# Patient Record
Sex: Male | Born: 1985 | Race: White | Hispanic: No | Smoking: Never smoker
Health system: Southern US, Community
[De-identification: ages and names within clinical notes are randomized; demographics above are authoritative.]

## PROBLEM LIST (undated history)

## (undated) DIAGNOSIS — J45909 Unspecified asthma, uncomplicated: Secondary | ICD-10-CM

## (undated) HISTORY — PX: APPENDECTOMY: SHX54

---

## 2015-11-06 ENCOUNTER — Emergency Department: Payer: BLUE CROSS/BLUE SHIELD

## 2015-11-06 ENCOUNTER — Emergency Department
Admission: EM | Admit: 2015-11-06 | Discharge: 2015-11-06 | Disposition: A | Discharge status: Home / Self Care | Payer: BLUE CROSS/BLUE SHIELD | Attending: Emergency Medicine | Admitting: Emergency Medicine

## 2015-11-06 ENCOUNTER — Encounter: Payer: Self-pay | Admitting: Emergency Medicine

## 2015-11-06 DIAGNOSIS — J45901 Unspecified asthma with (acute) exacerbation: Secondary | ICD-10-CM | POA: Insufficient documentation

## 2015-11-06 DIAGNOSIS — R091 Pleurisy: Secondary | ICD-10-CM | POA: Diagnosis present

## 2015-11-06 DIAGNOSIS — R110 Nausea: Secondary | ICD-10-CM | POA: Insufficient documentation

## 2015-11-06 HISTORY — DX: Unspecified asthma, uncomplicated: J45.909

## 2015-11-06 LAB — BASIC METABOLIC PANEL
ANION GAP: 5 (ref 5–15)
BUN: 10 mg/dL (ref 6–20)
CALCIUM: 9 mg/dL (ref 8.9–10.3)
CO2: 26 mmol/L (ref 22–32)
Chloride: 105 mmol/L (ref 101–111)
Creatinine, Ser: 1.17 mg/dL (ref 0.61–1.24)
Glucose, Bld: 96 mg/dL (ref 65–99)
Potassium: 3.7 mmol/L (ref 3.5–5.1)
SODIUM: 136 mmol/L (ref 135–145)

## 2015-11-06 LAB — CBC
HCT: 48.5 % (ref 40.0–52.0)
HEMOGLOBIN: 16.9 g/dL (ref 13.0–18.0)
MCH: 29.4 pg (ref 26.0–34.0)
MCHC: 34.8 g/dL (ref 32.0–36.0)
MCV: 84.7 fL (ref 80.0–100.0)
Platelets: 254 10*3/uL (ref 150–440)
RBC: 5.73 MIL/uL (ref 4.40–5.90)
RDW: 13.1 % (ref 11.5–14.5)
WBC: 7.8 10*3/uL (ref 3.8–10.6)

## 2015-11-06 LAB — TROPONIN I

## 2015-11-06 MED ORDER — IPRATROPIUM-ALBUTEROL 0.5-2.5 (3) MG/3ML IN SOLN
3.0000 mL | Freq: Once | RESPIRATORY_TRACT | Status: AC
  Administered 2015-11-06: 3 mL via RESPIRATORY_TRACT
  Filled 2015-11-06: qty 3

## 2015-11-06 MED ORDER — ALBUTEROL SULFATE HFA 108 (90 BASE) MCG/ACT IN AERS: 2.0000 | INHALATION_SPRAY | Freq: Four times a day (QID) | RESPIRATORY_TRACT | Status: DC | PRN

## 2015-11-06 MED ORDER — IOPAMIDOL (ISOVUE-370) INJECTION 76%
100.0000 mL | Freq: Once | INTRAVENOUS | Status: AC | PRN
  Administered 2015-11-06: 75 mL via INTRAVENOUS
  Filled 2015-11-06: qty 100

## 2015-11-06 NOTE — Discharge Instructions (Signed)
Your exam is reassuring, you are given an prescription for albuterol inhaler. Return to emergency room for any worsening condition including trouble breathing, fever, or any other symptoms concerning to.  Asthma, Adult Asthma is a condition of the lungs in which the airways tighten and narrow. Asthma can make it hard to breathe. Asthma cannot be cured, but medicine and lifestyle changes can help control it. Asthma may be started (triggered) by:  Animal skin flakes (dander).  Dust.  Cockroaches.  Pollen.  Mold.  Smoke.  Cleaning products.  Hair sprays or aerosol sprays.  Paint fumes or strong smells.  Cold air, weather changes, and winds.  Crying or laughing hard.  Stress.  Certain medicines or drugs.  Foods, such as dried fruit, potato chips, and sparkling grape juice.  Infections or conditions (colds, flu).  Exercise.  Certain medical conditions or diseases.  Exercise or tiring activities. HOME CARE   Take medicine as told by your doctor.  Use a peak flow meter as told by your doctor. A peak flow meter is a tool that measures how well the lungs are working.  Record and keep track of the peak flow meter's readings.  Understand and use the asthma action plan. An asthma action plan is a written plan for taking care of your asthma and treating your attacks.  To help prevent asthma attacks:  Do not smoke. Stay away from secondhand smoke.  Change your heating and air conditioning filter often.  Limit your use of fireplaces and wood stoves.  Get rid of pests (such as roaches and mice) and their droppings.  Throw away plants if you see mold on them.  Clean your floors. Dust regularly. Use cleaning products that do not smell.  Have someone vacuum when you are not home. Use a vacuum cleaner with a HEPA filter if possible.  Replace carpet with wood, tile, or vinyl flooring. Carpet can trap animal skin flakes and dust.  Use allergy-proof pillows, mattress  covers, and box spring covers.  Wash bed sheets and blankets every week in hot water and dry them in a dryer.  Use blankets that are made of polyester or cotton.  Clean bathrooms and kitchens with bleach. If possible, have someone repaint the walls in these rooms with mold-resistant paint. Keep out of the rooms that are being cleaned and painted.  Wash hands often. GET HELP IF:  You have make a whistling sound when breaking (wheeze), have shortness of breath, or have a cough even if taking medicine to prevent attacks.  The colored mucus you cough up (sputum) is thicker than usual.  The colored mucus you cough up changes from clear or white to yellow, green, gray, or bloody.  You have problems from the medicine you are taking such as:  A rash.  Itching.  Swelling.  Trouble breathing.  You need reliever medicines more than 2-3 times a week.  Your peak flow measurement is still at 50-79% of your personal best after following the action plan for 1 hour.  You have a fever. GET HELP RIGHT AWAY IF:   You seem to be worse and are not responding to medicine during an asthma attack.  You are short of breath even at rest.  You get short of breath when doing very little activity.  You have trouble eating, drinking, or talking.  You have chest pain.  You have a fast heartbeat.  Your lips or fingernails start to turn blue.  You are light-headed, dizzy, or faint.  Your peak flow is less than 50% of your personal best.   This information is not intended to replace advice given to you by your health care provider. Make sure you discuss any questions you have with your health care provider.   Document Released: 01/04/2008 Document Revised: 04/08/2015 Document Reviewed: 02/14/2013 Elsevier Interactive Patient Education [employer].

## 2015-11-06 NOTE — ED Provider Notes (Signed)
[facility] Emergency Department Provider Note   ____________________________________________  Time seen: I have reviewed the triage vital signs and the triage nursing note.  HISTORY  Chief Complaint Pleurisy and Nausea   Historian Patient  HPI Eugene Bartlett. is a 30 y.o. male with a history of asthma, who recently moved here from [place] and does not have his inhaler. He states that for the past couple months he's been having episodes of shortness of breath, also associated with chest pressure and some pleuritic chest pain. Today he felt nauseated. States that the chest discomfort is worse with coughing and deep breathing. No reported trauma. When asked what the patient thinks is going on, he states "it's my asthma. "Fevers. He does have a grand parent who had blood clots.    Past Medical History  Diagnosis Date  . Asthma     There are no active problems to display for this patient.   History reviewed. No pertinent past surgical history.  Current Outpatient Rx  Name  Route  Sig  Dispense  Refill  . albuterol (PROVENTIL HFA;VENTOLIN HFA) 108 (90 Base) MCG/ACT inhaler   Inhalation   Inhale 2 puffs into the lungs every 6 (six) hours as needed for wheezing or shortness of breath.   1 Inhaler   0     Allergies Review of patient's allergies indicates no known allergies.  No family history on file.  Social History Social History  Substance Use Topics  . Smoking status: Never Smoker   . Smokeless tobacco: Current User    Types: Chew  . Alcohol Use: No    Review of Systems  Constitutional: Negative for fever. Eyes: Negative for visual changes. ENT: Negative for sore throat. Cardiovascular: Positive for chest pain. Respiratory: Positive for shortness of breath. Gastrointestinal: Negative for abdominal pain, vomiting and diarrhea. Genitourinary: Negative for dysuria. Musculoskeletal: Negative for back pain. Skin: Negative for  rash. Neurological: Negative for headache. 10 point Review of Systems otherwise negative ____________________________________________   PHYSICAL EXAM:  VITAL SIGNS: ED Triage Vitals  Enc Vitals Group     BP 11/06/15 1857 123/93 mmHg     Pulse Rate 11/06/15 1857 68     Resp 11/06/15 1857 16     Temp 11/06/15 1857 98.2 F (36.8 C)     Temp Source 11/06/15 1857 Oral     SpO2 11/06/15 1857 98 %     Weight 11/06/15 1857 160 lb (72.576 kg)     Height 11/06/15 1857  (1.803 m)     Head Cir --      Peak Flow --      Pain Score 11/06/15 1856 7     Pain Loc --      Pain Edu? --      Excl. in GC? --      Constitutional: Alert and oriented. Well appearing and in no distress. HEENT   Head: Normocephalic and atraumatic.      Eyes: Conjunctivae are normal. PERRL. Normal extraocular movements.      Ears:         Nose: No congestion/rhinnorhea.   Mouth/Throat: Mucous membranes are moist.   Neck: No stridor. Cardiovascular/Chest: Normal rate, regular rhythm.  No murmurs, rubs, or gallops. Respiratory: Normal respiratory effort without tachypnea nor retractions. Breath sounds are clear and equal bilaterally. Mildly tight air sounds, but no real wheezing, rales or rhonchi Gastrointestinal: Soft. No distention, no guarding, no rebound. Nontender.    Genitourinary/rectal:Deferred Musculoskeletal: Nontender with  normal range of motion in all extremities. No joint effusions.  No lower extremity tenderness.  No edema. Neurologic:  Normal speech and language. No gross or focal neurologic deficits are appreciated. Skin:  Skin is warm, dry and intact. No rash noted. Psychiatric: Mood and affect are normal. Speech and behavior are normal. Patient exhibits appropriate insight and judgment.  ____________________________________________   EKG I, Governor Rooksebecca Jaileigh Weimer, MD, the attending physician have personally viewed and interpreted all ECGs.  34 bpm. Normal sinus rhythm. Narrow QRS. Normal  axis. Normal ST and T-wave ____________________________________________  LABS (pertinent positives/negatives)  Basic metabolic panel within normal limits CBC within normal limits Troponin less than 0.03  ____________________________________________  RADIOLOGY All Xrays were viewed by me. Imaging interpreted by Radiologist.  Chest x-ray two-view: No acute pulmonary process  Chest CT with contrast for PE: No pulmonary embolus or acute intrathoracic process __________________________________________  PROCEDURES  Procedure(s) performed: None  Critical Care performed: None  ____________________________________________   ED COURSE / ASSESSMENT AND PLAN  Pertinent labs & imaging results that were available during my care of the patient were reviewed by me and considered in my medical decision making (see chart for details).   Patient's here with a complaint of intermittent chest pains and shortness of breath which she thinks is due to asthma, however on exam he states he short of breath, but does not really have significant wheezing or really any wheezing on exam, just some tight breath sounds. I'm a little concerned about calling his symptoms due to asthma when he is not really having any respiratory wheezing. He also has a family history of blood clots, although no other significant risk factors.  Given my concern about and no alternate diagnosis is likely as the possibility for PE, I discussed with him risk versus benefit of obtaining CT of the chest to rule this out and we chose to proceed.  Clinically, chest CT showed no PE. I am to treat him for asthma with refilling his albuterol inhaler. He is referred to 3 different primary care physician so he can establish care here locally.    CONSULTATIONS:   None   Patient / Family / Caregiver informed of clinical course, medical decision-making process, and agree with plan.   I discussed return precautions, follow-up  instructions, and discharged instructions with patient and/or family.   ___________________________________________   FINAL CLINICAL IMPRESSION(S) / ED DIAGNOSES   Final diagnoses:  Asthma exacerbation              Note: This dictation was prepared with Dragon dictation. Any transcriptional errors that result from this process are unintentional   Governor Rooksebecca Kalen Neidert, MD 11/06/15 2219

## 2015-11-06 NOTE — ED Notes (Signed)
C/O right sided chest pain x 2 months and nausea x 2 hours.  Chest pain worsens with cough and deep breathing.  Denies injury.

## 2016-01-15 ENCOUNTER — Emergency Department: Payer: BLUE CROSS/BLUE SHIELD

## 2016-01-15 ENCOUNTER — Emergency Department
Admission: EM | Admit: 2016-01-15 | Discharge: 2016-01-15 | Disposition: A | Discharge status: Home / Self Care | Payer: BLUE CROSS/BLUE SHIELD | Attending: Emergency Medicine | Admitting: Emergency Medicine

## 2016-01-15 DIAGNOSIS — Z79899 Other long term (current) drug therapy: Secondary | ICD-10-CM | POA: Insufficient documentation

## 2016-01-15 DIAGNOSIS — F17220 Nicotine dependence, chewing tobacco, uncomplicated: Secondary | ICD-10-CM | POA: Diagnosis not present

## 2016-01-15 DIAGNOSIS — R0602 Shortness of breath: Secondary | ICD-10-CM | POA: Diagnosis present

## 2016-01-15 DIAGNOSIS — J45901 Unspecified asthma with (acute) exacerbation: Secondary | ICD-10-CM

## 2016-01-15 LAB — BASIC METABOLIC PANEL
Anion gap: 7 (ref 5–15)
BUN: 11 mg/dL (ref 6–20)
CO2: 28 mmol/L (ref 22–32)
CREATININE: 1.29 mg/dL — AB (ref 0.61–1.24)
Calcium: 9.1 mg/dL (ref 8.9–10.3)
Chloride: 104 mmol/L (ref 101–111)
GFR calc non Af Amer: >60 mL/min (ref >60)
Glucose, Bld: 84 mg/dL (ref 65–99)
Potassium: 3.7 mmol/L (ref 3.5–5.1)
SODIUM: 139 mmol/L (ref 135–145)

## 2016-01-15 LAB — CBC
HCT: 47 % (ref 40.0–52.0)
HEMOGLOBIN: 16.2 g/dL (ref 13.0–18.0)
MCH: 28.9 pg (ref 26.0–34.0)
MCHC: 34.5 g/dL (ref 32.0–36.0)
MCV: 83.7 fL (ref 80.0–100.0)
PLATELETS: 218 10*3/uL (ref 150–440)
RBC: 5.61 MIL/uL (ref 4.40–5.90)
RDW: 12.9 % (ref 11.5–14.5)
WBC: 8 10*3/uL (ref 3.8–10.6)

## 2016-01-15 LAB — TROPONIN I: Troponin I: <0.03 ng/mL (ref <0.031)

## 2016-01-15 MED ORDER — IPRATROPIUM-ALBUTEROL 0.5-2.5 (3) MG/3ML IN SOLN
3.0000 mL | Freq: Once | RESPIRATORY_TRACT | Status: AC
  Administered 2016-01-15: 3 mL via RESPIRATORY_TRACT
  Filled 2016-01-15: qty 3

## 2016-01-15 MED ORDER — IPRATROPIUM-ALBUTEROL 0.5-2.5 (3) MG/3ML IN SOLN
RESPIRATORY_TRACT | Status: AC
  Administered 2016-01-15: 21:00:00
  Filled 2016-01-15: qty 3

## 2016-01-15 MED ORDER — ALBUTEROL SULFATE HFA 108 (90 BASE) MCG/ACT IN AERS: 2.0000 | INHALATION_SPRAY | Freq: Four times a day (QID) | RESPIRATORY_TRACT | Status: AC | PRN

## 2016-01-15 MED ORDER — PREDNISONE 20 MG PO TABS: 40.0000 mg | ORAL_TABLET | Freq: Every day | ORAL | Status: AC

## 2016-01-15 MED ORDER — PREDNISONE 20 MG PO TABS
60.0000 mg | ORAL_TABLET | Freq: Once | ORAL | Status: AC
  Administered 2016-01-15: 60 mg via ORAL
  Filled 2016-01-15: qty 3

## 2016-01-15 NOTE — ED Notes (Signed)
Pt reports over the last few weeks having increased issues with his asthma. Pt reports EMS had to to come out to his house last night and gave him a duoneb which helped. Pt reports he uses symbocort 2 puff am and pm and proair as a rescue inhaler and does albuterol neb am and pm. Today having worse wheezing.

## 2016-01-15 NOTE — Discharge Instructions (Signed)

## 2016-01-15 NOTE — ED Notes (Signed)
Pt reports wheezing since noon today.  Used inhalers without relief.  Non smoker.  No fever.  Denies chest pain.  Breathing treatment given stat.

## 2016-01-15 NOTE — ED Provider Notes (Signed)
[facility] Emergency Department Provider Note  Time seen: 9:41 PM  I have reviewed the triage vital signs and the nursing notes.   HISTORY  Chief Complaint Asthma    HPI Eugene MoronBrian Dwain Mealing Jr. is a 30 y.o. male with a past medical history of asthma presents the emergency department with shortness of breath. According to the patient for the past 3 weeks he has been feeling intermittent and short of breath, subjective fevers/chills and having cough. States he is using his albuterol inhaler at home without relief. He called EMS last night and given 2 DuoNeb treatments and he felt much better, he states today continued shortness of breath so he came to the emergency department for evaluation. States intermittent chest pain but denies any currently. States the chest pain feels like a tightness, mild in severity.      Past Medical History  Diagnosis Date  . Asthma     There are no active problems to display for this patient.   No past surgical history on file.  Current Outpatient Rx  Name  Route  Sig  Dispense  Refill  . albuterol (PROVENTIL HFA;VENTOLIN HFA) 108 (90 Base) MCG/ACT inhaler   Inhalation   Inhale 2 puffs into the lungs every 6 (six) hours as needed for wheezing or shortness of breath.   1 Inhaler   0     Allergies Influenza vaccines  No family history on file.  Social History Social History  Substance Use Topics  . Smoking status: Never Smoker   . Smokeless tobacco: Current User    Types: Chew  . Alcohol Use: No    Review of Systems Constitutional: Subjective fevers/chills. Cardiovascular: Intermittent chest tightness Respiratory: Positive shortness of breath Gastrointestinal: Negative for abdominal pain Neurological: Negative for headache 10-point ROS otherwise negative.  ____________________________________________   PHYSICAL EXAM:  VITAL SIGNS: ED Triage Vitals  Enc Vitals Group     BP 01/15/16 2053 138/9 mmHg      Pulse Rate 01/15/16 2053 89     Resp 01/15/16 2053 20     Temp 01/15/16 2053 98.3 F (36.8 C)     Temp Source 01/15/16 2053 Oral     SpO2 01/15/16 2053 96 %     Weight 01/15/16 2053 160 lb (72.576 kg)     Height 01/15/16 2053 5\' 11"  (1.803 m)     Head Cir --      Peak Flow --      Pain Score 01/15/16 2055 5     Pain Loc --      Pain Edu? --      Excl. in GC? --     Constitutional: Alert and oriented. Well appearing and in no distress. Eyes: Normal exam ENT   Head: Normocephalic and atraumatic.   Mouth/Throat: Mucous membranes are moist. Cardiovascular: Normal rate, regular rhythm. No murmur Respiratory: Normal respiratory effort without tachypnea nor retractions. Very minimal expiratory wheeze bilaterally. Gastrointestinal: Soft and nontender. No distention. Musculoskeletal: Nontender with normal range of motion in all extremities.  Neurologic:  Normal speech and language. No gross focal neurologic deficits Skin:  Skin is warm, dry and intact.  Psychiatric: Mood and affect are normal.   ____________________________________________     RADIOLOGY  Chest x-ray negative  ____________________________________________    INITIAL IMPRESSION / ASSESSMENT AND PLAN / ED COURSE  Pertinent labs & imaging results that were available during my care of the patient were reviewed by me and considered in my medical decision making (  see chart for details).  Patient presents with intermittent wheeze, cough and chest tightness on and off for the past 3 weeks. States subjective fevers but has not measured a temperature. We'll check labs, chest x-ray, treat with DuoNebs and prednisone. Patient agreeable to plan of care.  X-ray negative. Labs within normal limits. Vitals remained well appearing. Patient remains well appearing. Suspect likely upper respiratory infection causing asthma exacerbation. We'll discharge him prednisone with continued albuterol use. Patient agreeable to  plan.  ____________________________________________   FINAL CLINICAL IMPRESSION(S) / ED DIAGNOSES  Asthma exacerbation   Minna Antis, MD 01/15/16 2219

## 2016-01-15 NOTE — ED Notes (Signed)
Scanner not working in room to scan meds/bracelet.

## 2017-06-04 IMAGING — CT CT ANGIO CHEST
1 of 2 series · 18 of 30 positions shown · IV contrast (APPLIED)
Comparison: Radiographs earlier this day. Report from chest CT
10/10/2013, no images available

CLINICAL DATA: Right-sided chest pain for 2 months. Pleuritic chest
pain and shortness of breath.

EXAM:
CT ANGIOGRAPHY CHEST WITH CONTRAST
TECHNIQUE: Multidetector CT imaging of the chest was performed using the
standard protocol during bolus administration of intravenous
contrast. Multiplanar CT image reconstructions and MIPs were
obtained to evaluate the vascular anatomy.
CONTRAST:  75 mL Isovue 370 IV

[Series 5: pe 1.0 thins · axial · 0.71mm/px · z∈[-281,+3]mm · 18 of 320 slices shown]
[im 18/320  lung]
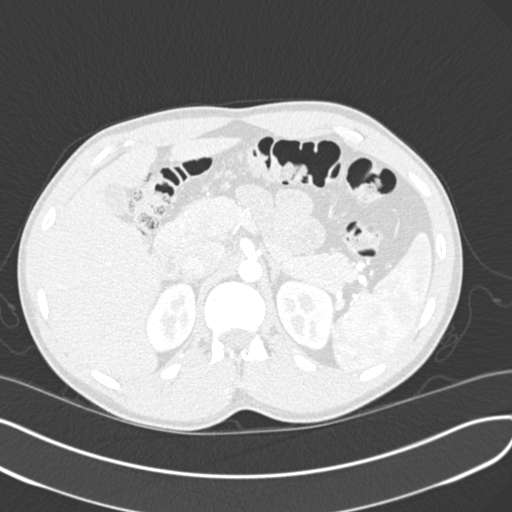
[im 36/320  mediastinal]
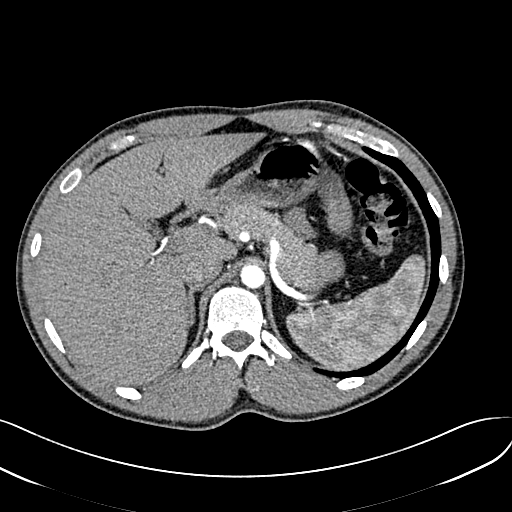
[im 54/320  lung]
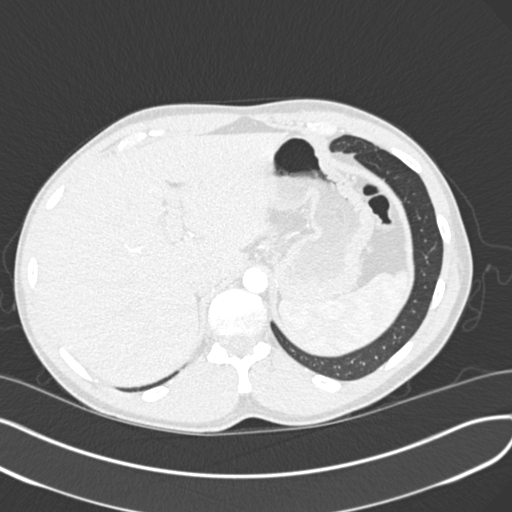
[im 71/320  mediastinal]
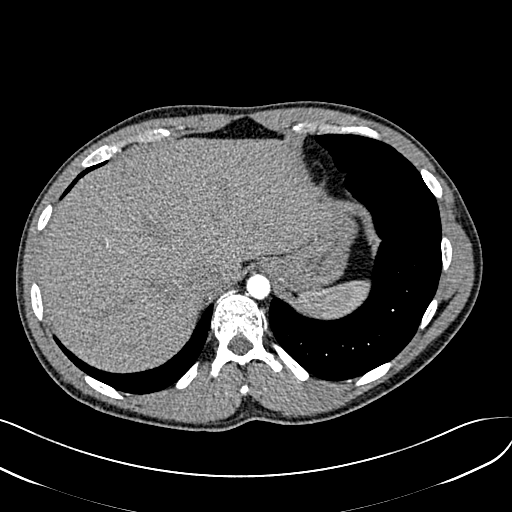
[im 89/320  lung]
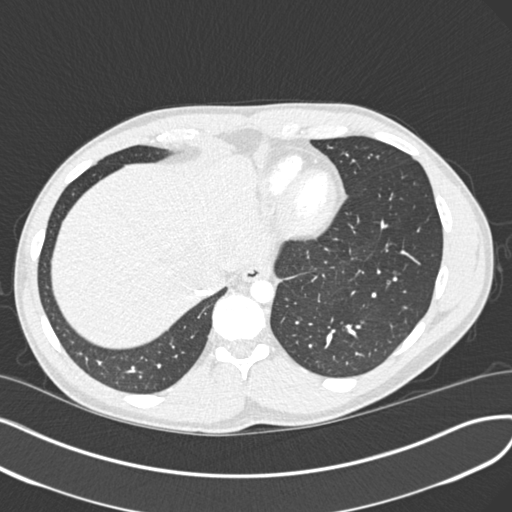
[im 107/320  mediastinal]
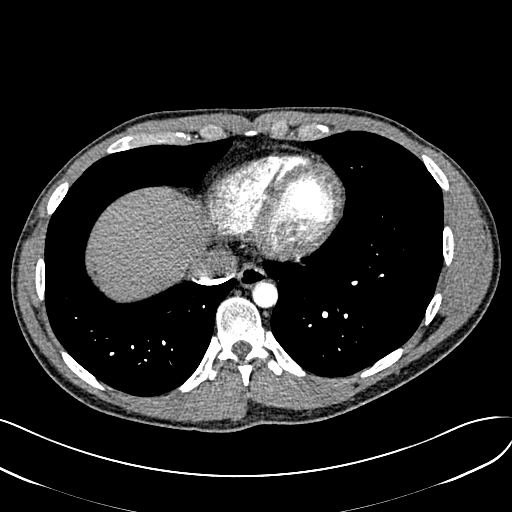
[im 125/320  lung]
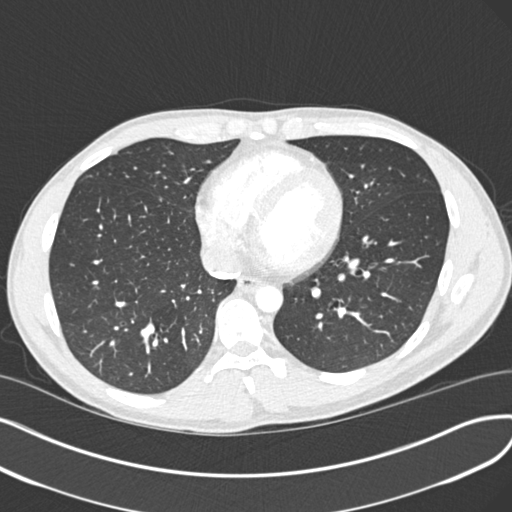
[im 142/320  mediastinal]
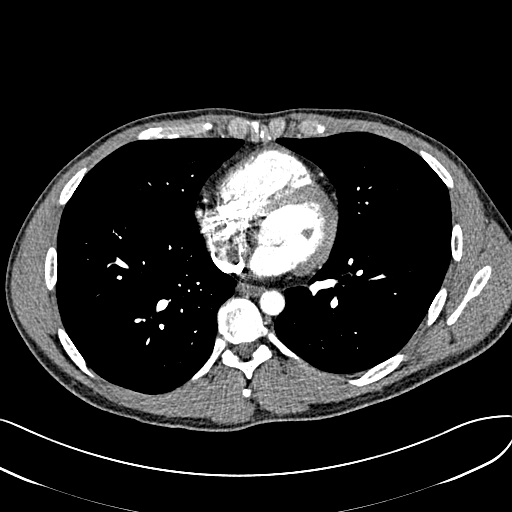
[im 151/320  lung]
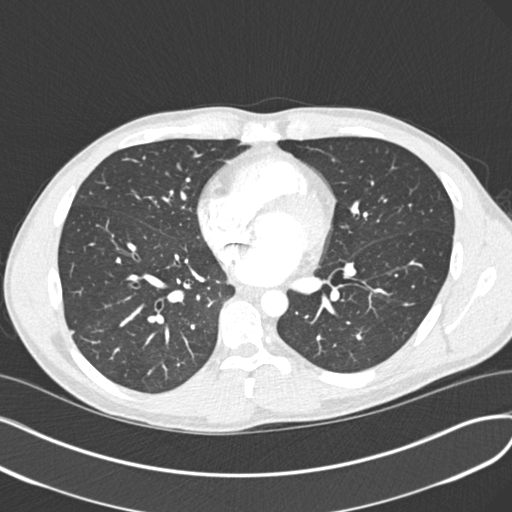
[im 160/320  mediastinal]
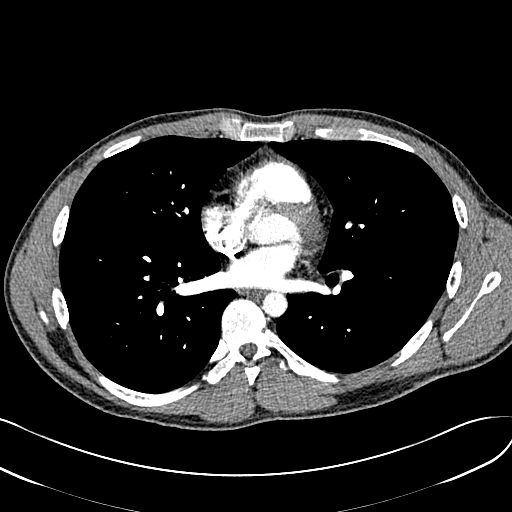
[im 178/320  lung]
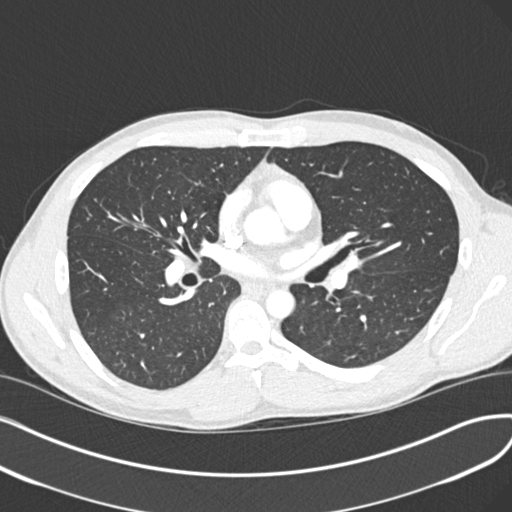
[im 195/320  mediastinal]
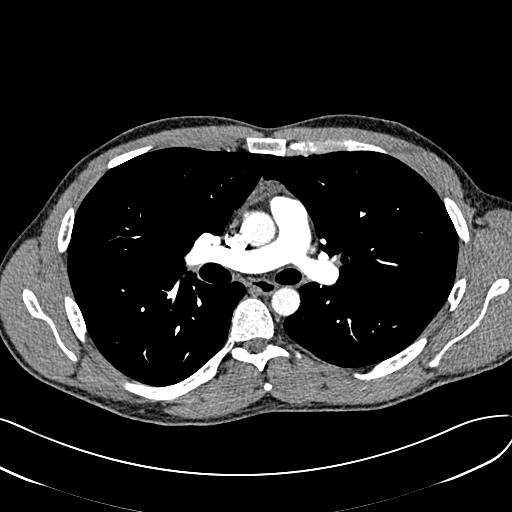
[im 213/320  lung]
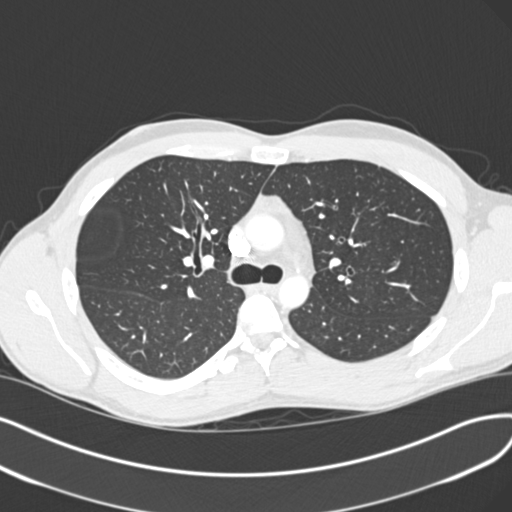
[im 231/320  mediastinal]
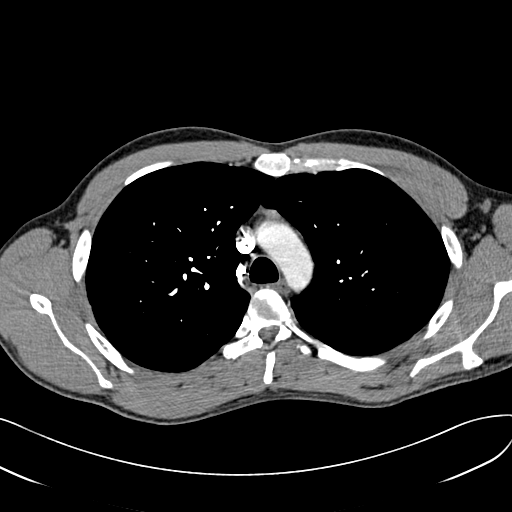
[im 249/320  lung]
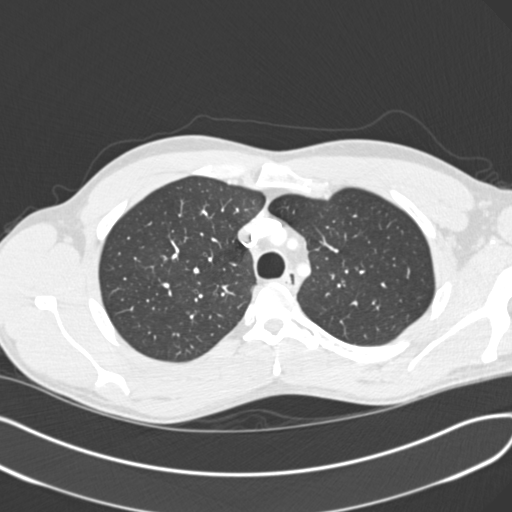
[im 266/320  mediastinal]
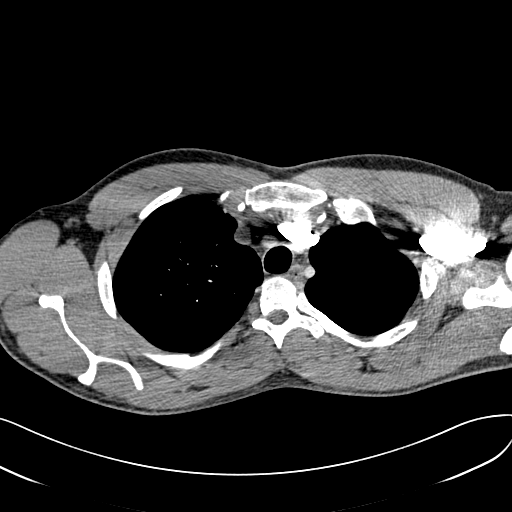
[im 284/320  lung]
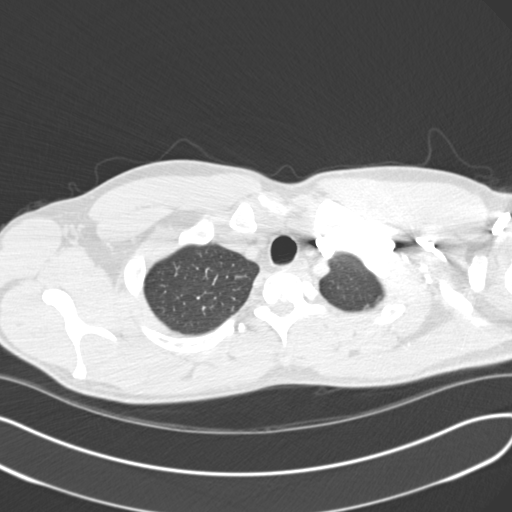
[im 302/320  mediastinal]
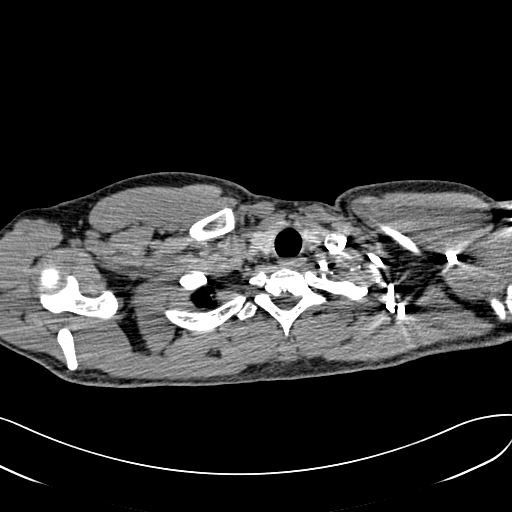

[18 of 30 positions shown; findings below may reference images not displayed]

FINDINGS: Mediastinum/Lymph Nodes: No pulmonary emboli or thoracic aortic
dissection identified. No masses or pathologically enlarged lymph
nodes identified. Minimal residual thymus. No pericardial effusion.

Lungs/Pleura: No pulmonary mass, consolidation, or effusion. Trace
subpleural atelectasis in the right lung.

Upper abdomen: No acute findings.

Musculoskeletal: No chest wall mass or suspicious bone lesions
identified. Mild broad-based rightward curvature of the thoracic
spine. Bone island in T8. No abnormality of of the right ribs to
account for chest pain.

Review of the MIP images confirms the above findings.
IMPRESSION: No pulmonary embolus or acute intrathoracic process.

## 2017-08-13 IMAGING — CR DG CHEST 2V
2 series · 2 of 2 positions shown · non-contrast
Comparison: Chest CT 11/06/2015.

CLINICAL DATA: Patient with asthma, worsening over the last few
days.

EXAM:
CHEST  2 VIEW

[chest pa]
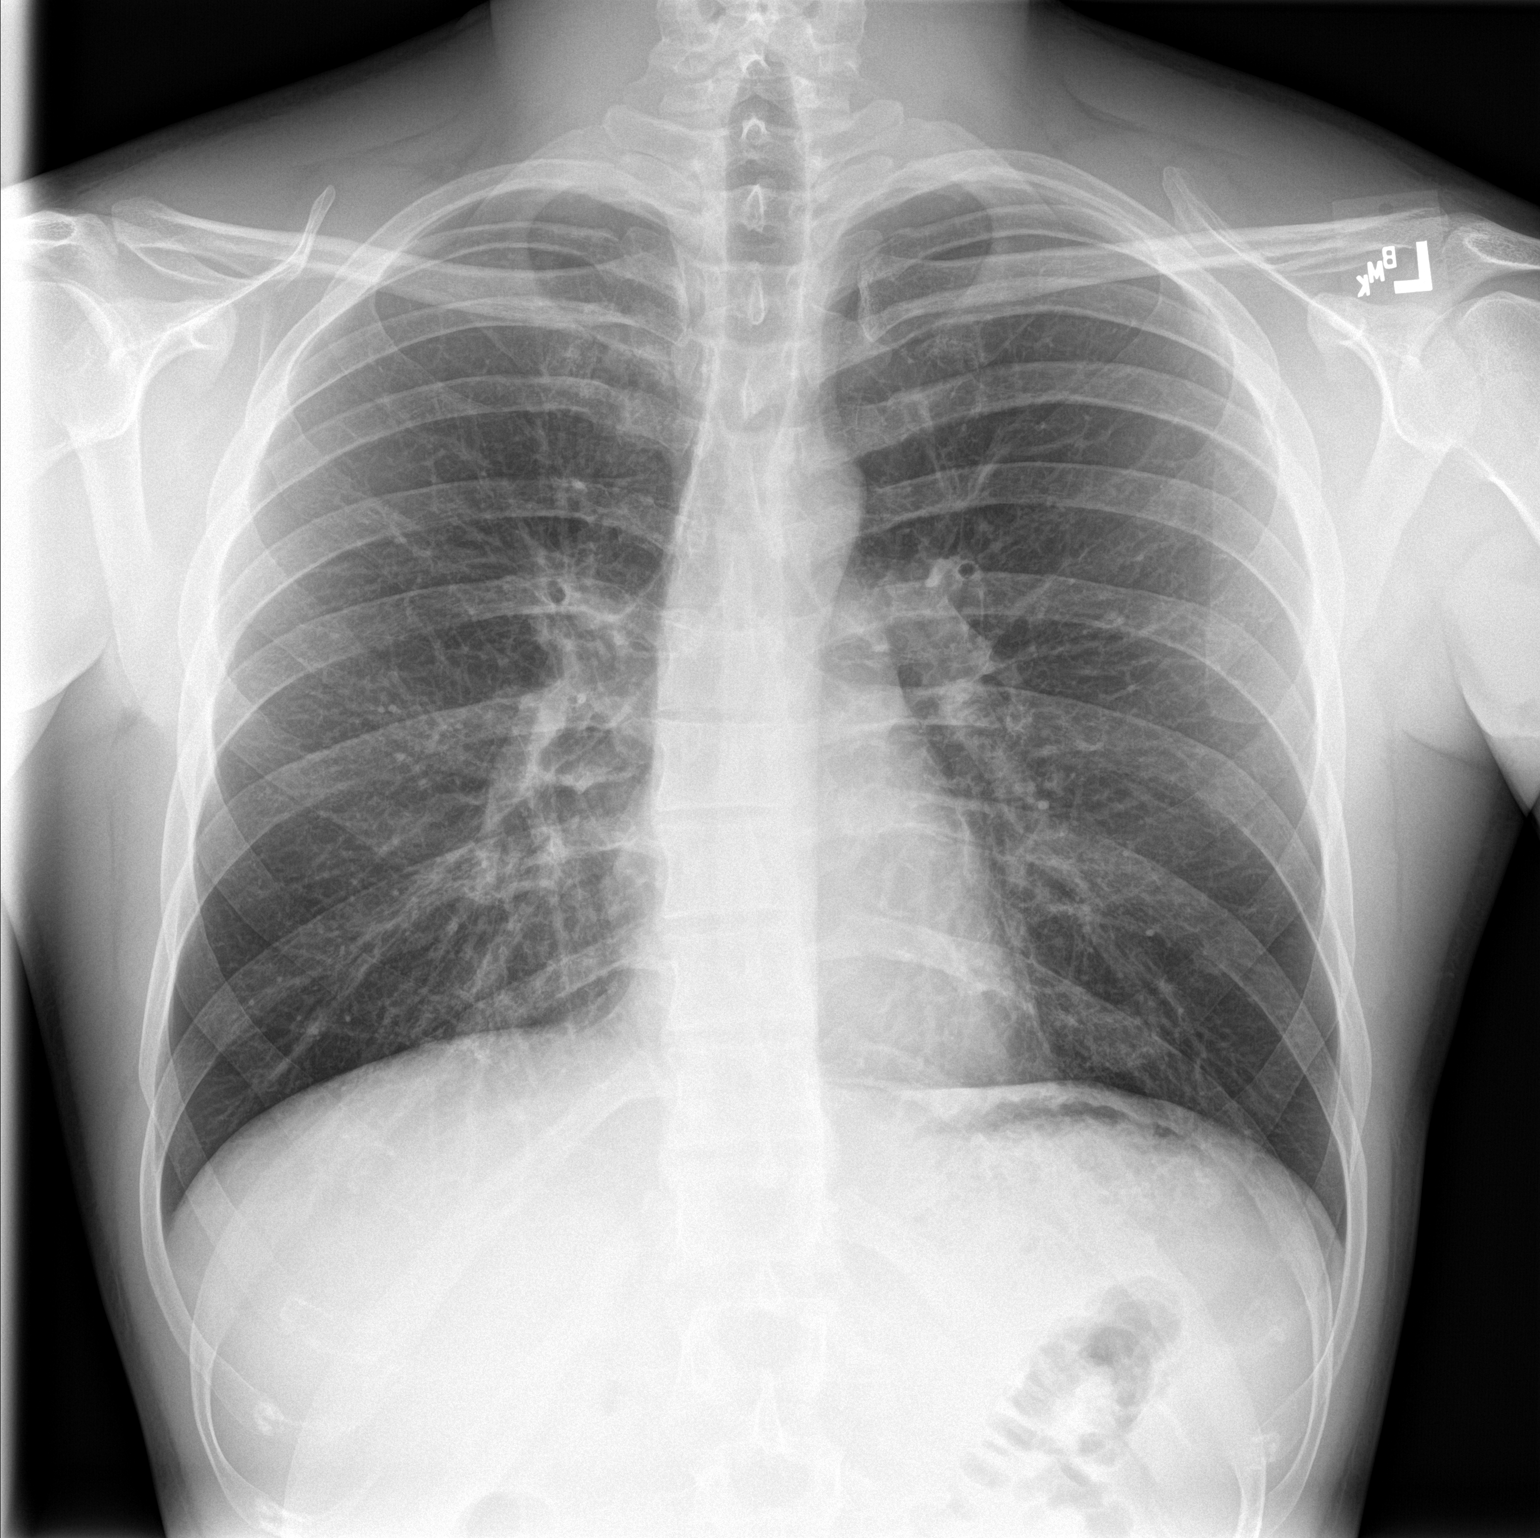

[chest lat]
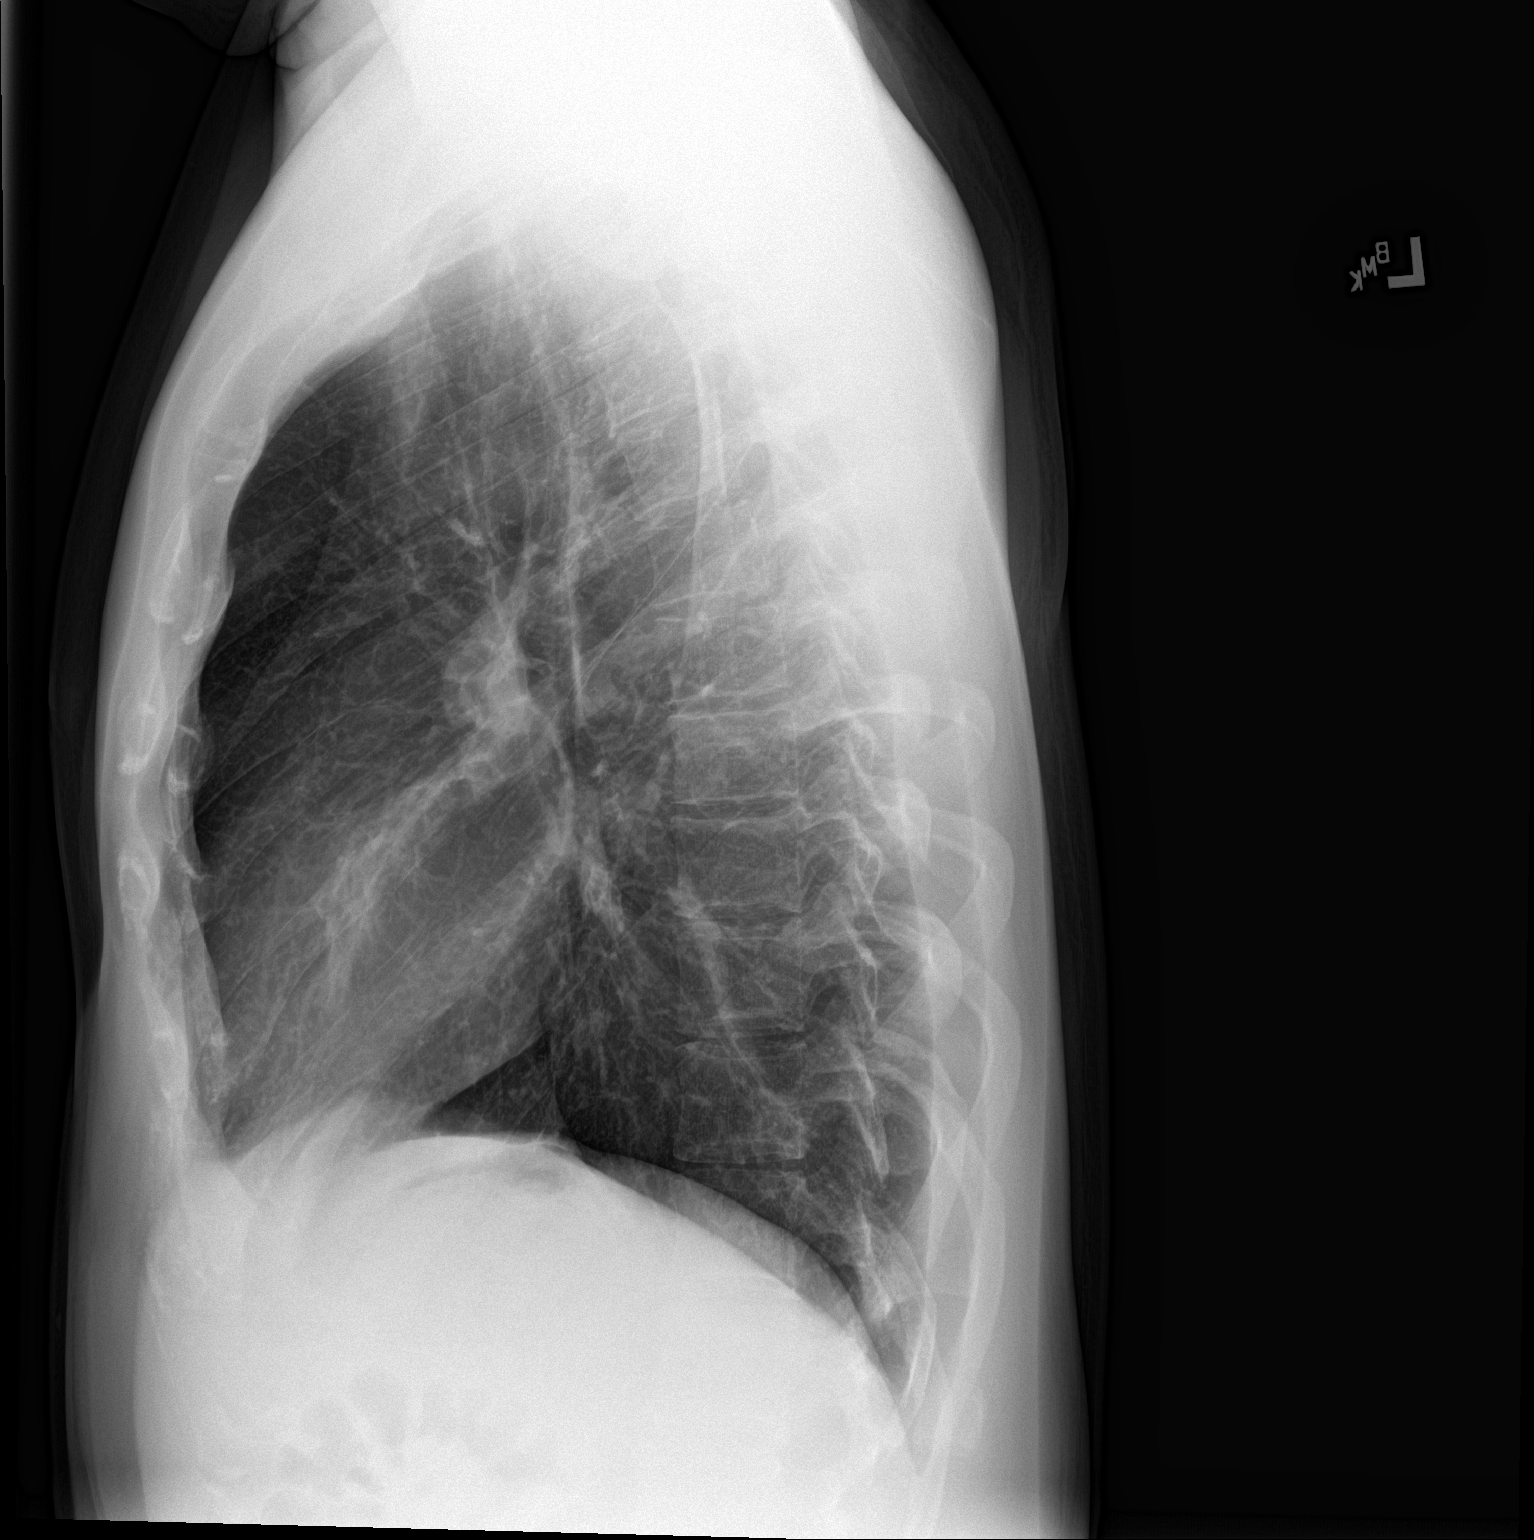

[2 of 2 positions shown; findings below may reference images not displayed]

FINDINGS: The heart size and mediastinal contours are within normal limits.
Both lungs are clear. The visualized skeletal structures are
unremarkable.
IMPRESSION: No active cardiopulmonary disease.

## 2018-02-09 ENCOUNTER — Other Ambulatory Visit: Payer: Self-pay

## 2018-02-09 ENCOUNTER — Emergency Department (HOSPITAL_COMMUNITY)
Admission: EM | Admit: 2018-02-09 | Discharge: 2018-02-09 | Disposition: A | Discharge status: Home / Self Care | Payer: BLUE CROSS/BLUE SHIELD | Attending: Emergency Medicine | Admitting: Emergency Medicine

## 2018-02-09 ENCOUNTER — Encounter (HOSPITAL_COMMUNITY): Payer: Self-pay | Admitting: *Deleted

## 2018-02-09 DIAGNOSIS — Z79899 Other long term (current) drug therapy: Secondary | ICD-10-CM | POA: Insufficient documentation

## 2018-02-09 DIAGNOSIS — T63461A Toxic effect of venom of wasps, accidental (unintentional), initial encounter: Secondary | ICD-10-CM

## 2018-02-09 DIAGNOSIS — F17220 Nicotine dependence, chewing tobacco, uncomplicated: Secondary | ICD-10-CM | POA: Insufficient documentation

## 2018-02-09 DIAGNOSIS — J45909 Unspecified asthma, uncomplicated: Secondary | ICD-10-CM | POA: Insufficient documentation

## 2018-02-09 DIAGNOSIS — T7840XA Allergy, unspecified, initial encounter: Secondary | ICD-10-CM

## 2018-02-09 MED ORDER — EPINEPHRINE 0.3 MG/0.3ML IJ SOAJ: 0.3000 mg | Freq: Once | INTRAMUSCULAR | 0 refills | Status: AC

## 2018-02-09 MED ORDER — PREDNISONE 10 MG PO TABS: 20.0000 mg | ORAL_TABLET | Freq: Every day | ORAL | 0 refills | Status: AC

## 2018-02-09 NOTE — Discharge Instructions (Addendum)
Please return for any problem.  Follow-up with your regular doctor as instructed. °

## 2018-02-09 NOTE — ED Triage Notes (Signed)
PT here via [facility] after being stung by several bees.  Upon EMS arrival pt was cool, clammy, multiple hives,vs (110 hr, 110/95, O2 90) lung sounds diminished with wheezing.  Pt took 50 mg benadry.  Given 0.15 epi, 125 solumedrol, 10 albuterol, 0.5 atrovent.  18g L fa.

## 2018-02-09 NOTE — ED Provider Notes (Signed)
[facility] Provider Note   CSN: [id number] Arrival date & time: 02/09/18  1052     History   Chief Complaint Chief Complaint  Patient presents with  . Allergic Reaction    HPI Eugene MoronBrian Dwain Boissonneault Jr. is a 32 y.o. male.  32 year old male with prior history as below presents after yellowjacket stings.  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  30 minutes after being stung he developed shortness of breath and he felt like his throat was swelling.  He denies prior history of allergies to wasp envenomation or yellow jackets.  After treatment by EMS patient feels significantly improved.  The history is provided by the patient.  Allergic Reaction  Presenting symptoms: difficulty breathing, itching, rash and wheezing   Difficulty breathing:    Severity:  Mild   Onset quality:  Sudden Itching:    Location:  Full body   Severity:  Mild   Onset quality:  Sudden Rash:    Location:  Full body   Quality comment:  Urticaria   Severity:  Mild   Onset quality:  Sudden Severity:  Moderate Duration:  30 minutes Prior allergic episodes:  No prior episodes Relieved by:  Antihistamines and epinephrine   Past Medical History:  Diagnosis Date  . Asthma     There are no active problems to display for this patient.   Past Surgical History:  Procedure Laterality Date  . APPENDECTOMY          Home Medications    Prior to Admission medications   Medication Sig Start Date End Date Taking? Authorizing Provider  albuterol (PROVENTIL HFA;VENTOLIN HFA) 108 (90 Base) MCG/ACT inhaler Inhale 2 puffs into the lungs every 6 (six) hours as needed for wheezing or shortness of breath. 01/15/16   Minna AntisPaduchowski, Kevin, MD  budesonide-formoterol (SYMBICORT) 160-4.5 MCG/ACT inhaler Inhale 2 puffs into the lungs 2 (two) times daily.    [provider]  EPINEPHrine (EPIPEN 2-PAK) 0.3 mg/0.3 mL IJ SOAJ  injection Inject 0.3 mLs (0.3 mg total) into the muscle once for 1 dose. 02/09/18 02/09/18  Wynetta FinesMessick, Peter C, MD  predniSONE (DELTASONE) 10 MG tablet Take 2 tablets (20 mg total) by mouth daily. 02/09/18   Wynetta FinesMessick, Peter C, MD  predniSONE (DELTASONE) 20 MG tablet Take 2 tablets (40 mg total) by mouth daily. 01/15/16   Minna AntisPaduchowski, Kevin, MD    Family History No family history on file.  Social History Social History   Tobacco Use  . Smoking status: Never Smoker  . Smokeless tobacco: Current User    Types: Chew  Substance Use Topics  . Alcohol use: No  . Drug use: No     Allergies   Bee venom and Influenza vaccines   Review of Systems Review of Systems  Respiratory: Positive for wheezing.   Skin: Positive for itching and rash.  All other systems reviewed and are negative.    Physical Exam Updated Vital Signs BP 105/61   Pulse 85   Temp 98.4 F (36.9 C) (Oral)   Resp 14   Ht 5\' 11"  (1.803 m)   Wt 72.6 kg (160 lb)   SpO2 98%   BMI 22.32 kg/m   Physical Exam  Constitutional: He is oriented to person, place, and time. He appears well-developed and well-nourished. No distress.  HENT:  Head: Normocephalic and atraumatic.  Mouth/Throat: Oropharynx is clear and moist.  Eyes: Pupils are equal, round, and reactive to light.  Conjunctivae and EOM are normal.  Neck: Normal range of motion. Neck supple.  Cardiovascular: Normal rate, regular rhythm and normal heart sounds.  Pulmonary/Chest: Effort normal and breath sounds normal. No respiratory distress.  Abdominal: Soft. He exhibits no distension. There is no tenderness.  Musculoskeletal: Normal range of motion. He exhibits no edema or deformity.  Neurological: He is alert and oriented to person, place, and time.  Skin: Skin is warm and dry.  Mild diffuse urticaria  Psychiatric: He has a normal mood and affect.  Nursing note and vitals reviewed.    ED Treatments / Results  Labs (all labs ordered are listed, but only  abnormal results are displayed) Labs Reviewed - No data to display  EKG None  Radiology No results found.  Procedures Procedures (including critical care time)  Medications Ordered in ED Medications - No data to display   Initial Impression / Assessment and Plan / ED Course  I have reviewed the triage vital signs and the nursing notes.  Pertinent labs & imaging results that were available during my care of the patient were reviewed by me and considered in my medical decision making (see chart for details).     MDM  Screen complete  Patient is presenting following yellowjacket envenomation.  Patient appears of had a allergic response to same.  Following EMS treatment he is improved.  Following a period of observation in the ED is without evidence of rebound symptoms.  Patient appears to be stable for discharge home.  He understands need for close follow-up.  Strict return precautions are given and understood.  Final Clinical Impressions(s) / ED Diagnoses   Final diagnoses:  Allergic reaction, initial encounter  Yellow jacket sting, accidental or unintentional, initial encounter    ED Discharge Orders        Ordered    predniSONE (DELTASONE) 10 MG tablet  Daily     02/09/18 1430    EPINEPHrine (EPIPEN 2-PAK) 0.3 mg/0.3 mL IJ SOAJ injection   Once     02/09/18 1430       Wynetta Fines, MD 02/09/18 1436
